# Patient Record
Sex: Female | Born: 1980 | Race: White | Hispanic: No | Marital: Married | Smoking: Never smoker
Health system: Southern US, Community
[De-identification: ages and names within clinical notes are randomized; demographics above are authoritative.]

## PROBLEM LIST (undated history)

## (undated) DIAGNOSIS — U071 COVID-19: Secondary | ICD-10-CM

## (undated) DIAGNOSIS — R7989 Other specified abnormal findings of blood chemistry: Secondary | ICD-10-CM

## (undated) DIAGNOSIS — F419 Anxiety disorder, unspecified: Secondary | ICD-10-CM

## (undated) DIAGNOSIS — R112 Nausea with vomiting, unspecified: Secondary | ICD-10-CM

## (undated) DIAGNOSIS — K8020 Calculus of gallbladder without cholecystitis without obstruction: Secondary | ICD-10-CM

## (undated) HISTORY — PX: WISDOM TOOTH EXTRACTION: SHX21

---

## 2021-06-02 ENCOUNTER — Other Ambulatory Visit: Payer: Self-pay | Admitting: Family

## 2021-06-02 ENCOUNTER — Other Ambulatory Visit (HOSPITAL_COMMUNITY): Payer: Self-pay | Admitting: Family

## 2021-06-02 DIAGNOSIS — R1010 Upper abdominal pain, unspecified: Secondary | ICD-10-CM

## 2021-06-11 ENCOUNTER — Other Ambulatory Visit: Payer: Self-pay

## 2021-06-11 ENCOUNTER — Ambulatory Visit
Admission: RE | Admit: 2021-06-11 | Discharge: 2021-06-11 | Disposition: A | Discharge status: Home / Self Care | Payer: No Typology Code available for payment source | Source: Ambulatory Visit | Attending: Family | Admitting: Family

## 2021-06-11 DIAGNOSIS — R1010 Upper abdominal pain, unspecified: Secondary | ICD-10-CM | POA: Insufficient documentation

## 2022-03-04 ENCOUNTER — Other Ambulatory Visit: Payer: Self-pay | Admitting: Emergency Medicine

## 2022-03-04 ENCOUNTER — Other Ambulatory Visit (HOSPITAL_COMMUNITY): Payer: Self-pay | Admitting: Emergency Medicine

## 2022-03-04 DIAGNOSIS — R1010 Upper abdominal pain, unspecified: Secondary | ICD-10-CM

## 2022-03-04 DIAGNOSIS — R10819 Abdominal tenderness, unspecified site: Secondary | ICD-10-CM

## 2022-03-05 ENCOUNTER — Other Ambulatory Visit: Payer: Self-pay | Admitting: Emergency Medicine

## 2022-03-05 DIAGNOSIS — R10819 Abdominal tenderness, unspecified site: Secondary | ICD-10-CM

## 2022-03-05 DIAGNOSIS — R1010 Upper abdominal pain, unspecified: Secondary | ICD-10-CM

## 2022-03-15 ENCOUNTER — Ambulatory Visit
Admission: RE | Admit: 2022-03-15 | Discharge: 2022-03-15 | Disposition: A | Discharge status: Home / Self Care | Payer: BC Managed Care – PPO | Source: Ambulatory Visit | Attending: Emergency Medicine | Admitting: Emergency Medicine

## 2022-03-15 DIAGNOSIS — R10819 Abdominal tenderness, unspecified site: Secondary | ICD-10-CM | POA: Diagnosis not present

## 2022-03-15 DIAGNOSIS — R1010 Upper abdominal pain, unspecified: Secondary | ICD-10-CM | POA: Insufficient documentation

## 2022-03-19 ENCOUNTER — Ambulatory Visit: Payer: Self-pay | Admitting: General Surgery

## 2022-03-19 NOTE — H&P (View-Only) (Signed)
PATIENT PROFILE: Alexandria Meadows is a 41 y.o. female who presents to the Clinic for consultation at the request of Dr. Mayford Knife for evaluation of cholelithiasis  PCP:  Joyce Gross, MD  HISTORY OF PRESENT ILLNESS: Alexandria Meadows reports she continued having right upper quadrant pain.  Pain radiates to both side of the abdomen.  Pain aggravated by oral intake.  She denies any alleviating factors.  She denies any fever or chills.  She was evaluated by her physician and an ultrasound was made showing cholelithiasis without sign of cholecystitis.  I personally evaluated the images.   PROBLEM LIST: Cholelithiasis  GENERAL REVIEW OF SYSTEMS:   General ROS: negative for - chills, fatigue, fever, weight gain or weight loss Allergy and Immunology ROS: negative for - hives  Hematological and Lymphatic ROS: negative for - bleeding problems or bruising, negative for palpable nodes Endocrine ROS: negative for - heat or cold intolerance, hair changes Respiratory ROS: negative for - cough, shortness of breath or wheezing Cardiovascular ROS: no chest pain or palpitations GI ROS: negative for nausea, vomiting, positive for abdominal pain Musculoskeletal ROS: negative for - joint swelling or muscle pain Neurological ROS: negative for - confusion, syncope Dermatological ROS: negative for pruritus and rash Psychiatric: negative for anxiety, depression, difficulty sleeping and memory loss  MEDICATIONS: Current Outpatient Medications  Medication Sig Dispense Refill   citalopram (CELEXA) 20 MG tablet once daily     multivitamin tablet Take 1 tablet by mouth once daily     norgestimate-ethinyl estradioL (SPRINTEC 0.25/35, 28,) 0.25-35 mg-mcg tablet Take 1 tablet by mouth once daily     No current facility-administered medications for this visit.    ALLERGIES: Penicillins  PAST MEDICAL HISTORY: History reviewed. No pertinent past medical history.  PAST SURGICAL HISTORY: History reviewed. No pertinent  surgical history.   FAMILY HISTORY: Family History  Problem Relation Age of Onset   Autoimmune disease Mother    Prostate cancer Father    No Known Problems Sister    No Known Problems Brother      SOCIAL HISTORY: Social History   Socioeconomic History   Marital status: Married  Tobacco Use   Smoking status: Never   Smokeless tobacco: Never  Vaping Use   Vaping Use: Never used  Substance and Sexual Activity   Alcohol use: Never   Drug use: Never    PHYSICAL EXAM: Vitals:   03/19/22 1020  BP: 117/76  Pulse: 81   Body mass index is 33.61 kg/m. Weight: 91.6 kg (202 lb)   GENERAL: Alert, active, oriented x3  HEENT: Pupils equal reactive to light. Extraocular movements are intact. Sclera clear. Palpebral conjunctiva normal red color.Pharynx clear.  NECK: Supple with no palpable mass and no adenopathy.  LUNGS: Sound clear with no rales rhonchi or wheezes.  HEART: Regular rhythm S1 and S2 without murmur.  ABDOMEN: Soft and depressible, nontender with no palpable mass, no hepatomegaly.   EXTREMITIES: Well-developed well-nourished symmetrical with no dependent edema.  NEUROLOGICAL: Awake alert oriented, facial expression symmetrical, moving all extremities.  REVIEW OF DATA: I have reviewed the following data today: Office Visit on 03/19/2022  Component Date Value   WBC (White Blood Cell Co* 03/19/2022 5.5    RBC (Red Blood Cell Coun* 03/19/2022 4.77    Hemoglobin 03/19/2022 14.5    Hematocrit 03/19/2022 44.5    MCV (Mean Corpuscular Vo* 03/19/2022 93.3    MCH (Mean Corpuscular He* 03/19/2022 30.4    MCHC (Mean Corpuscular H* 03/19/2022 32.6  Platelet Count 03/19/2022 194    RDW-CV (Red Cell Distrib* 03/19/2022 12.3    MPV (Mean Platelet Volum* 03/19/2022 11.2    Neutrophils 03/19/2022 3.28    Lymphocytes 03/19/2022 1.63    Monocytes 03/19/2022 0.48    Eosinophils 03/19/2022 0.08    Basophils 03/19/2022 0.04    Neutrophil % 03/19/2022 59.5    Lymphocyte  % 03/19/2022 29.6    Monocyte % 03/19/2022 8.7    Eosinophil % 03/19/2022 1.5    Basophil% 03/19/2022 0.7    Immature Granulocyte % 03/19/2022 0.0    Immature Granulocyte Cou* 03/19/2022 0.00      ASSESSMENT: Alexandria Meadows is a 41 y.o. female presenting for consultation for cholelithiasis.    Patient was oriented about the diagnosis of cholelithiasis. Also oriented about what is the gallbladder, its anatomy and function and the implications of having stones. The patient was oriented about the treatment alternatives (observation vs cholecystectomy). Patient was oriented that a low percentage of patient will continue to have similar pain symptoms even after the gallbladder is removed. Surgical technique (open vs laparoscopic) was discussed. It was also discussed the goals of the surgery (decrease the pain episodes and avoid the risk of cholecystitis) and the risk of surgery including: bleeding, infection, common bile duct injury, stone retention, injury to other organs such as bowel, liver, stomach, other complications such as hernia, bowel obstruction among others. Also discussed with patient about anesthesia and its complications such as: reaction to medications, pneumonia, heart complications, death, among others.   Cholelithiasis without cholecystitis [K80.20]  PLAN: 1.  Robotic assisted laparoscopic cholecystectomy (73532) 2.  CBC, CMP 3.  Do not take aspirin 5 days before the procedure 5.  Contact us if has any question or concern.   Patient verbalized understanding, all questions were answered, and were agreeable with the plan outlined above.     Carolan Shiver, MD  Electronically signed by Carolan Shiver, MD

## 2022-03-24 ENCOUNTER — Encounter
Admission: RE | Admit: 2022-03-24 | Discharge: 2022-03-24 | Disposition: A | Discharge status: Home / Self Care | Payer: BC Managed Care – PPO | Source: Ambulatory Visit | Attending: General Surgery | Admitting: General Surgery

## 2022-03-24 VITALS — Ht 65.0 in | Wt 202.0 lb

## 2022-03-24 DIAGNOSIS — Z01818 Encounter for other preprocedural examination: Secondary | ICD-10-CM

## 2022-03-24 HISTORY — DX: Other specified abnormal findings of blood chemistry: R79.89

## 2022-03-24 HISTORY — DX: COVID-19: U07.1

## 2022-03-24 HISTORY — DX: Other specified postprocedural states: R11.2

## 2022-03-24 HISTORY — DX: Anxiety disorder, unspecified: F41.9

## 2022-03-24 HISTORY — DX: Calculus of gallbladder without cholecystitis without obstruction: K80.20

## 2022-03-24 NOTE — Patient Instructions (Signed)
Your procedure is scheduled on:03-26-22 Friday Report to the Registration Desk on the 1st floor of the [facility].Then proceed to the 2nd floor Surgery Desk To find out your arrival time, please call [phone] between 1PM - 3PM on:03-25-22 Thursday If your arrival time is 6:00 am, do not arrive prior to that time as the [facility] entrance doors do not open until 6:00 am.  REMEMBER: Instructions that are not followed completely may result in serious medical risk, up to and including death; or upon the discretion of your surgeon and anesthesiologist your surgery may need to be rescheduled.  Do not eat food OR drink any liquids after midnight the night before surgery.  No gum chewing, lozengers or hard candies.  TAKE THESE MEDICATIONS THE MORNING OF SURGERY WITH A SIP OF WATER: -citalopram (CELEXA)  One week prior to surgery: Stop Anti-inflammatories (NSAIDS) such as Advil, Aleve, Ibuprofen, Motrin, Naproxen, Naprosyn and Aspirin based products such as Excedrin, Goodys Powder, BC Powder.You may however, take Tylenol if needed for pain up until the day of surgery.  Stop ANY OVER THE COUNTER supplements/vitamins NOW (03-24-22) until after surgery.  No Alcohol for 24 hours before or after surgery.  No Smoking including e-cigarettes for 24 hours prior to surgery.  No chewable tobacco products for at least 6 hours prior to surgery.  No nicotine patches on the day of surgery.  Do not use any "recreational" drugs for at least a week prior to your surgery.  Please be advised that the combination of cocaine and anesthesia may have negative outcomes, up to and including death. If you test positive for cocaine, your surgery will be cancelled.  On the morning of surgery brush your teeth with toothpaste and water, you may rinse your mouth with mouthwash if you wish. Do not swallow any toothpaste or mouthwash.  Use CHG Soap as directed on instruction sheet.  Do not wear jewelry, make-up,  hairpins, clips or nail polish.  Do not wear lotions, powders, or perfumes.   Do not shave body from the neck down 48 hours prior to surgery just in case you cut yourself which could leave a site for infection.  Also, freshly shaved skin may become irritated if using the CHG soap.  Contact lenses, hearing aids and dentures may not be worn into surgery.  Do not bring valuables to the hospital. [facility] is not responsible for any missing/lost belongings or valuables.   Notify your doctor if there is any change in your medical condition (cold, fever, infection).  Wear comfortable clothing (specific to your surgery type) to the hospital.  After surgery, you can help prevent lung complications by doing breathing exercises.  Take deep breaths and cough every 1-2 hours. Your doctor may order a device called an Incentive Spirometer to help you take deep breaths. When coughing or sneezing, hold a pillow firmly against your incision with both hands. This is called "splinting." Doing this helps protect your incision. It also decreases belly discomfort.  If you are being admitted to the hospital overnight, leave your suitcase in the car. After surgery it may be brought to your room.  If you are being discharged the day of surgery, you will not be allowed to drive home. You will need a responsible adult (18 years or older) to drive you home and stay with you that night.   If you are taking public transportation, you will need to have a responsible adult (18 years or older) with you. Please confirm  with your physician that it is acceptable to use public transportation.   Please call the [facility] at [phone] if you have any questions about these instructions.  Surgery Visitation Policy:  Patients undergoing a surgery or procedure may have two family members or support persons with them as long as the person is not COVID-19 positive or experiencing its symptoms.

## 2022-03-26 ENCOUNTER — Ambulatory Visit
Admission: RE | Admit: 2022-03-26 | Discharge: 2022-03-26 | Disposition: A | Discharge status: Home / Self Care | Payer: BC Managed Care – PPO | Source: Ambulatory Visit | Attending: General Surgery | Admitting: General Surgery

## 2022-03-26 ENCOUNTER — Encounter
Admission: RE | Disposition: A | Discharge status: Home / Self Care | Payer: Self-pay | Source: Ambulatory Visit | Attending: General Surgery

## 2022-03-26 ENCOUNTER — Encounter: Payer: Self-pay | Admitting: General Surgery

## 2022-03-26 ENCOUNTER — Ambulatory Visit: Payer: BC Managed Care – PPO | Admitting: Registered Nurse

## 2022-03-26 ENCOUNTER — Other Ambulatory Visit: Payer: Self-pay

## 2022-03-26 DIAGNOSIS — K8020 Calculus of gallbladder without cholecystitis without obstruction: Secondary | ICD-10-CM | POA: Diagnosis present

## 2022-03-26 DIAGNOSIS — K8010 Calculus of gallbladder with chronic cholecystitis without obstruction: Secondary | ICD-10-CM | POA: Diagnosis not present

## 2022-03-26 DIAGNOSIS — Z8616 Personal history of COVID-19: Secondary | ICD-10-CM | POA: Diagnosis not present

## 2022-03-26 LAB — POCT PREGNANCY, URINE: Preg Test, Ur: NEGATIVE

## 2022-03-26 SURGERY — CHOLECYSTECTOMY, ROBOT-ASSISTED, LAPAROSCOPIC
Anesthesia: General | Site: Abdomen

## 2022-03-26 MED ORDER — FENTANYL CITRATE (PF) 100 MCG/2ML IJ SOLN
INTRAMUSCULAR | Status: AC
  Administered 2022-03-26: 25 ug via INTRAVENOUS
  Filled 2022-03-26: qty 2

## 2022-03-26 MED ORDER — OXYCODONE HCL 5 MG PO TABS
ORAL_TABLET | ORAL | Status: AC
  Filled 2022-03-26: qty 1

## 2022-03-26 MED ORDER — PROPOFOL 1000 MG/100ML IV EMUL
INTRAVENOUS | Status: AC
  Filled 2022-03-26: qty 100

## 2022-03-26 MED ORDER — LIDOCAINE HCL (PF) 2 % IJ SOLN
INTRAMUSCULAR | Status: AC
  Filled 2022-03-26: qty 5

## 2022-03-26 MED ORDER — OXYCODONE HCL 5 MG PO TABS: 5.0000 mg | ORAL_TABLET | Freq: Once | ORAL | Status: AC

## 2022-03-26 MED ORDER — FENTANYL CITRATE (PF) 100 MCG/2ML IJ SOLN
INTRAMUSCULAR | Status: DC | PRN
  Administered 2022-03-26: 50 ug via INTRAVENOUS
  Administered 2022-03-26 (×2): 25 ug via INTRAVENOUS

## 2022-03-26 MED ORDER — DEXMEDETOMIDINE (PRECEDEX) IN NS 20 MCG/5ML (4 MCG/ML) IV SYRINGE
PREFILLED_SYRINGE | INTRAVENOUS | Status: DC | PRN
  Administered 2022-03-26 (×3): 4 ug via INTRAVENOUS

## 2022-03-26 MED ORDER — OXYCODONE HCL 5 MG PO TABS
5.0000 mg | ORAL_TABLET | Freq: Once | ORAL | Status: AC
  Administered 2022-03-26: 5 mg via ORAL

## 2022-03-26 MED ORDER — KETOROLAC TROMETHAMINE 30 MG/ML IJ SOLN
30.0000 mg | Freq: Once | INTRAMUSCULAR | Status: AC
  Administered 2022-03-26: 30 mg via INTRAVENOUS

## 2022-03-26 MED ORDER — ONDANSETRON HCL 4 MG/2ML IJ SOLN
INTRAMUSCULAR | Status: DC | PRN
  Administered 2022-03-26: 4 mg via INTRAVENOUS

## 2022-03-26 MED ORDER — FENTANYL CITRATE (PF) 100 MCG/2ML IJ SOLN
INTRAMUSCULAR | Status: AC
  Filled 2022-03-26: qty 2

## 2022-03-26 MED ORDER — ONDANSETRON HCL 4 MG/2ML IJ SOLN
INTRAMUSCULAR | Status: AC
  Filled 2022-03-26: qty 2

## 2022-03-26 MED ORDER — CEFAZOLIN SODIUM-DEXTROSE 2-4 GM/100ML-% IV SOLN: 2.0000 g | INTRAVENOUS | Status: DC

## 2022-03-26 MED ORDER — SUGAMMADEX SODIUM 200 MG/2ML IV SOLN
INTRAVENOUS | Status: DC | PRN
  Administered 2022-03-26: 177 mg via INTRAVENOUS

## 2022-03-26 MED ORDER — CEFAZOLIN SODIUM-DEXTROSE 2-4 GM/100ML-% IV SOLN
INTRAVENOUS | Status: AC
  Filled 2022-03-26: qty 100

## 2022-03-26 MED ORDER — INDOCYANINE GREEN 25 MG IV SOLR
1.2500 mg | Freq: Once | INTRAVENOUS | Status: AC
  Administered 2022-03-26: 1.25 mg via INTRAVENOUS
  Filled 2022-03-26: qty 0.5

## 2022-03-26 MED ORDER — KETOROLAC TROMETHAMINE 30 MG/ML IJ SOLN
INTRAMUSCULAR | Status: AC
  Filled 2022-03-26: qty 1

## 2022-03-26 MED ORDER — FAMOTIDINE 20 MG PO TABS
ORAL_TABLET | ORAL | Status: AC
  Administered 2022-03-26: 20 mg
  Filled 2022-03-26: qty 1

## 2022-03-26 MED ORDER — PROPOFOL 10 MG/ML IV BOLUS
INTRAVENOUS | Status: DC | PRN
  Administered 2022-03-26: 150 mg via INTRAVENOUS

## 2022-03-26 MED ORDER — DEXAMETHASONE SODIUM PHOSPHATE 10 MG/ML IJ SOLN
INTRAMUSCULAR | Status: DC | PRN
  Administered 2022-03-26: 10 mg via INTRAVENOUS

## 2022-03-26 MED ORDER — ROCURONIUM BROMIDE 100 MG/10ML IV SOLN
INTRAVENOUS | Status: DC | PRN
  Administered 2022-03-26: 5 mg via INTRAVENOUS
  Administered 2022-03-26: 60 mg via INTRAVENOUS
  Administered 2022-03-26: 20 mg via INTRAVENOUS

## 2022-03-26 MED ORDER — MIDAZOLAM HCL 2 MG/2ML IJ SOLN
INTRAMUSCULAR | Status: DC | PRN
  Administered 2022-03-26: 2 mg via INTRAVENOUS

## 2022-03-26 MED ORDER — CHLORHEXIDINE GLUCONATE 0.12 % MT SOLN
OROMUCOSAL | Status: AC
  Administered 2022-03-26: 15 mL
  Filled 2022-03-26: qty 15

## 2022-03-26 MED ORDER — MIDAZOLAM HCL 2 MG/2ML IJ SOLN
INTRAMUSCULAR | Status: AC
  Filled 2022-03-26: qty 2

## 2022-03-26 MED ORDER — DIPHENHYDRAMINE HCL 50 MG/ML IJ SOLN
INTRAMUSCULAR | Status: AC
  Filled 2022-03-26: qty 1

## 2022-03-26 MED ORDER — "VISTASEAL 4 ML SINGLE DOSE KIT "
PACK | CUTANEOUS | Status: DC | PRN
  Administered 2022-03-26: 4 mL via TOPICAL

## 2022-03-26 MED ORDER — 0.9 % SODIUM CHLORIDE (POUR BTL) OPTIME
TOPICAL | Status: DC | PRN
  Administered 2022-03-26: 500 mL

## 2022-03-26 MED ORDER — OXYCODONE HCL 5 MG PO TABS
ORAL_TABLET | ORAL | Status: AC
  Administered 2022-03-26: 5 mg via ORAL
  Filled 2022-03-26: qty 1

## 2022-03-26 MED ORDER — BUPIVACAINE-EPINEPHRINE (PF) 0.25% -1:200000 IJ SOLN
INTRAMUSCULAR | Status: AC
  Filled 2022-03-26: qty 30

## 2022-03-26 MED ORDER — LIDOCAINE HCL (CARDIAC) PF 100 MG/5ML IV SOSY
PREFILLED_SYRINGE | INTRAVENOUS | Status: DC | PRN
  Administered 2022-03-26: 100 mg via INTRAVENOUS

## 2022-03-26 MED ORDER — KETAMINE HCL 50 MG/5ML IJ SOSY
PREFILLED_SYRINGE | INTRAMUSCULAR | Status: AC
Start: 2022-03-26 — End: ?
  Filled 2022-03-26: qty 5

## 2022-03-26 MED ORDER — OXYCODONE-ACETAMINOPHEN 5-325 MG PO TABS: 1.0000 | ORAL_TABLET | ORAL | 0 refills | Status: AC | PRN

## 2022-03-26 MED ORDER — DEXAMETHASONE SODIUM PHOSPHATE 10 MG/ML IJ SOLN
INTRAMUSCULAR | Status: AC
  Filled 2022-03-26: qty 1

## 2022-03-26 MED ORDER — PROPOFOL 500 MG/50ML IV EMUL
INTRAVENOUS | Status: DC | PRN
  Administered 2022-03-26: 175 ug/kg/min via INTRAVENOUS

## 2022-03-26 MED ORDER — ROCURONIUM BROMIDE 10 MG/ML (PF) SYRINGE
PREFILLED_SYRINGE | INTRAVENOUS | Status: AC
  Filled 2022-03-26: qty 10

## 2022-03-26 MED ORDER — LACTATED RINGERS IV SOLN: INTRAVENOUS | Status: DC

## 2022-03-26 MED ORDER — FENTANYL CITRATE (PF) 100 MCG/2ML IJ SOLN
25.0000 ug | INTRAMUSCULAR | Status: AC | PRN
  Administered 2022-03-26 (×6): 25 ug via INTRAVENOUS

## 2022-03-26 MED ORDER — PROPOFOL 1000 MG/100ML IV EMUL
INTRAVENOUS | Status: AC
  Filled 2022-03-26: qty 200

## 2022-03-26 MED ORDER — ONDANSETRON HCL 4 MG/2ML IJ SOLN
4.0000 mg | Freq: Once | INTRAMUSCULAR | Status: AC | PRN
  Administered 2022-03-26: 4 mg via INTRAVENOUS

## 2022-03-26 MED ORDER — MIDAZOLAM HCL 2 MG/2ML IJ SOLN
INTRAMUSCULAR | Status: AC
Start: 2022-03-26 — End: ?
  Filled 2022-03-26: qty 2

## 2022-03-26 MED ORDER — ACETAMINOPHEN 10 MG/ML IV SOLN
INTRAVENOUS | Status: DC | PRN
  Administered 2022-03-26: 1000 mg via INTRAVENOUS

## 2022-03-26 MED ORDER — BUPIVACAINE-EPINEPHRINE 0.25% -1:200000 IJ SOLN
INTRAMUSCULAR | Status: DC | PRN
  Administered 2022-03-26: 30 mL

## 2022-03-26 SURGICAL SUPPLY — 56 items
APPLICATOR VISTASEAL 35 (MISCELLANEOUS) ×1 IMPLANT
BAG PRESSURE INF REUSE 1000 (BAG) IMPLANT
BLADE SURG SZ11 CARB STEEL (BLADE) ×3 IMPLANT
CANNULA REDUC XI 12-8 STAPL (CANNULA) ×1
CANNULA REDUCER 12-8 DVNC XI (CANNULA) ×2 IMPLANT
CATH REDDICK CHOLANGI 4FR 50CM (CATHETERS) IMPLANT
CLIP LIGATING HEM O LOK PURPLE (MISCELLANEOUS) IMPLANT
CLIP LIGATING HEMO O LOK GREEN (MISCELLANEOUS) ×3 IMPLANT
DERMABOND ADVANCED (GAUZE/BANDAGES/DRESSINGS) ×1
DERMABOND ADVANCED .7 DNX12 (GAUZE/BANDAGES/DRESSINGS) ×2 IMPLANT
DRAPE ARM DVNC X/XI (DISPOSABLE) ×8 IMPLANT
DRAPE C-ARM XRAY 36X54 (DRAPES) IMPLANT
DRAPE COLUMN DVNC XI (DISPOSABLE) ×2 IMPLANT
DRAPE DA VINCI XI ARM (DISPOSABLE) ×4
DRAPE DA VINCI XI COLUMN (DISPOSABLE) ×1
ELECT REM PT RETURN 9FT ADLT (ELECTROSURGICAL) ×3
ELECTRODE REM PT RTRN 9FT ADLT (ELECTROSURGICAL) ×2 IMPLANT
GLOVE BIO SURGEON STRL SZ 6.5 (GLOVE) ×6 IMPLANT
GLOVE BIOGEL PI IND STRL 6.5 (GLOVE) ×4 IMPLANT
GLOVE BIOGEL PI INDICATOR 6.5 (GLOVE) ×2
GOWN STRL REUS W/ TWL LRG LVL3 (GOWN DISPOSABLE) ×6 IMPLANT
GOWN STRL REUS W/TWL LRG LVL3 (GOWN DISPOSABLE) ×3
GRASPER SUT TROCAR 14GX15 (MISCELLANEOUS) ×3 IMPLANT
IRRIGATOR SUCT 8 DISP DVNC XI (IRRIGATION / IRRIGATOR) IMPLANT
IRRIGATOR SUCTION 8MM XI DISP (IRRIGATION / IRRIGATOR)
IV CATH ANGIO 12GX3 LT BLUE (NEEDLE) IMPLANT
IV NS 1000ML (IV SOLUTION)
IV NS 1000ML BAXH (IV SOLUTION) IMPLANT
KIT PINK PAD W/HEAD ARE REST (MISCELLANEOUS) ×3
KIT PINK PAD W/HEAD ARM REST (MISCELLANEOUS) ×2 IMPLANT
LABEL OR SOLS (LABEL) ×3 IMPLANT
MANIFOLD NEPTUNE II (INSTRUMENTS) ×3 IMPLANT
NDL INSUFFLATION 14GA 120MM (NEEDLE) ×2 IMPLANT
NEEDLE HYPO 22GX1.5 SAFETY (NEEDLE) ×3 IMPLANT
NEEDLE INSUFFLATION 14GA 120MM (NEEDLE) ×3 IMPLANT
NS IRRIG 500ML POUR BTL (IV SOLUTION) ×3 IMPLANT
OBTURATOR OPTICAL STANDARD 8MM (TROCAR) ×1
OBTURATOR OPTICAL STND 8 DVNC (TROCAR) ×2
OBTURATOR OPTICALSTD 8 DVNC (TROCAR) ×2 IMPLANT
PACK LAP CHOLECYSTECTOMY (MISCELLANEOUS) ×3 IMPLANT
SEAL CANN UNIV 5-8 DVNC XI (MISCELLANEOUS) ×6 IMPLANT
SEAL XI 5MM-8MM UNIVERSAL (MISCELLANEOUS) ×3
SET TUBE SMOKE EVAC HIGH FLOW (TUBING) ×3 IMPLANT
SOLUTION ELECTROLUBE (MISCELLANEOUS) ×3 IMPLANT
SPIKE FLUID TRANSFER (MISCELLANEOUS) ×6 IMPLANT
SPONGE T-LAP 4X18 ~~LOC~~+RFID (SPONGE) IMPLANT
STAPLER CANNULA SEAL DVNC XI (STAPLE) ×2 IMPLANT
STAPLER CANNULA SEAL XI (STAPLE) ×1
SUT MNCRL 4-0 (SUTURE) ×1
SUT MNCRL 4-0 27XMFL (SUTURE) ×2
SUT MNCRL AB 4-0 PS2 18 (SUTURE) ×1 IMPLANT
SUT VICRYL 0 AB UR-6 (SUTURE) ×3 IMPLANT
SUTURE MNCRL 4-0 27XMF (SUTURE) ×2 IMPLANT
SYS BAG RETRIEVAL 10MM (BASKET) ×3
SYSTEM BAG RETRIEVAL 10MM (BASKET) ×2 IMPLANT
WATER STERILE IRR 500ML POUR (IV SOLUTION) ×3 IMPLANT

## 2022-03-26 NOTE — Anesthesia Procedure Notes (Addendum)
Procedure Name: Intubation Date/Time: 03/26/2022 10:31 AM  Performed by: Doreen Salvage, CRNAPre-anesthesia Checklist: Patient identified, Patient being monitored, Timeout performed, Emergency Drugs available and Suction available Patient Re-evaluated:Patient Re-evaluated prior to induction Oxygen Delivery Method: Circle system utilized Preoxygenation: Pre-oxygenation with 100% oxygen Induction Type: IV induction Ventilation: Mask ventilation without difficulty Laryngoscope Size: Mac and 3 Grade View: Grade I Tube type: Oral Tube size: 6.5 mm Number of attempts: 1 Airway Equipment and Method: Stylet Placement Confirmation: ETT inserted through vocal cords under direct vision, positive ETCO2 and breath sounds checked- equal and bilateral Secured at: 23 cm Tube secured with: Tape Dental Injury: Teeth and Oropharynx as per pre-operative assessment  Comments: Small chip noted to right front tooth prior to intubation. Chip remains the same post intubation

## 2022-03-26 NOTE — Anesthesia Preprocedure Evaluation (Signed)
Anesthesia Evaluation  Patient identified by MRN, date of birth, ID band Patient awake    Reviewed: Allergy & Precautions, H&P , NPO status , Patient's Chart, lab work & pertinent test results, reviewed documented beta blocker date and time   History of Anesthesia Complications (+) PONV and history of anesthetic complications  Airway Mallampati: II  TM Distance: >3 FB Neck ROM: full    Dental  (+) Teeth Intact   Pulmonary neg pulmonary ROS,    Pulmonary exam normal        Cardiovascular Exercise Tolerance: Good negative cardio ROS Normal cardiovascular exam Rhythm:regular Rate:Normal     Neuro/Psych Anxiety negative neurological ROS  negative psych ROS   GI/Hepatic negative GI ROS, Neg liver ROS,   Endo/Other  negative endocrine ROS  Renal/GU negative Renal ROS  negative genitourinary   Musculoskeletal   Abdominal   Peds  Hematology negative hematology ROS (+)   Anesthesia Other Findings Past Medical History: No date: Anxiety No date: Cholelithiasis No date: COVID-19 No date: Elevated d-dimer No date: PONV (postoperative nausea and vomiting)     Comment:  very nauseated with wisdom teeth and after 2 epidurals Past Surgical History: No date: WISDOM TOOTH EXTRACTION     Comment:  age 41 BMI    Body Mass Index: 32.45 kg/m     Reproductive/Obstetrics negative OB ROS                             Anesthesia Physical Anesthesia Plan  ASA: 2  Anesthesia Plan: General ETT   Post-op Pain Management:    Induction:   PONV Risk Score and Plan:   Airway Management Planned:   Additional Equipment:   Intra-op Plan:   Post-operative Plan:   Informed Consent: I have reviewed the patients History and Physical, chart, labs and discussed the procedure including the risks, benefits and alternatives for the proposed anesthesia with the patient or authorized representative who has  indicated his/her understanding and acceptance.     Dental Advisory Given  Plan Discussed with: CRNA  Anesthesia Plan Comments:         Anesthesia Quick Evaluation

## 2022-03-26 NOTE — Transfer of Care (Signed)
Immediate Anesthesia Transfer of Care Note  Patient: Alexandria Meadows  Procedure(s) Performed: XI ROBOTIC ASSISTED LAPAROSCOPIC CHOLECYSTECTOMY (Abdomen) INDOCYANINE GREEN FLUORESCENCE IMAGING (ICG)  Patient Location: PACU  Anesthesia Type:General  Level of Consciousness: drowsy  Airway & Oxygen Therapy: Patient Spontanous Breathing and Patient connected to face mask oxygen  Post-op Assessment: Report given to RN and Post -op Vital signs reviewed and stable  Post vital signs: Reviewed and stable  Last Vitals:  Vitals Value Taken Time  BP 111/73 03/26/22 1236  Temp 36.4 C 03/26/22 1236  Pulse 71 03/26/22 1237  Resp 21 03/26/22 1237  SpO2 100 % 03/26/22 1237  Vitals shown include unvalidated device data.  Last Pain:  Vitals:   03/26/22 0938  TempSrc: Temporal  PainSc: 0-No pain         Complications: No notable events documented.

## 2022-03-26 NOTE — Op Note (Signed)
Preoperative diagnosis: Cholelithiasis  Postoperative diagnosis: Same  Procedure: Robotic Assisted Laparoscopic Cholecystectomy.   Anesthesia: GETA   Surgeon: Dr. Hazle Quant  Wound Classification: Clean Contaminated  Indications: Patient is a 41 y.o. female developed right upper quadrant pain and on workup was found to have cholelithiasis with a normal common duct. Robotic Assisted Laparoscopic cholecystectomy was elected.  Findings: Very short cystic duct going into right hepatic duct Critical view of safety achieved Cystic duct and artery identified, ligated and divided Adequate hemostasis  Page 1: 2 biliary structures that goes from liver to duodenum. No cystic duct identified.  Page 2: Very short cystic duct going to right hepatic duct Page 3-4: Right and left hepatic ducts            Description of procedure: The patient was placed on the operating table in the supine position. General anesthesia was induced. A time-out was completed verifying correct patient, procedure, site, positioning, and implant(s) and/or special equipment prior to beginning this procedure. An orogastric tube was placed. The abdomen was prepped and draped in the usual sterile fashion.  An incision was made in a natural skin line below the umbilicus.  The fascia was elevated and the Veress needle inserted. Proper position was confirmed by aspiration and saline meniscus test.  The abdomen was insufflated with carbon dioxide to a pressure of 15 mmHg. The patient tolerated insufflation well. A 8-mm trocar was then inserted in optiview fashion.  The laparoscope was inserted and the abdomen inspected. No injuries from initial trocar placement were noted. Additional trocars were then inserted in the following locations: an 8-mm trocar in the left lateral abdomen, and another two 8-mm trocars to the right side of the abdomen 5 cm appart. The umbilical trocar was changed to a 12 mm trocar all under direct  visualization. The abdomen was inspected and no abnormalities were found. The table was placed in the reverse Trendelenburg position with the right side up. The robotic arms were docked and target anatomy identified. Instrument inserted under direct visualization.  Filmy adhesions between the gallbladder and omentum, duodenum and transverse colon were lysed with electrocautery. The dome of the gallbladder was grasped with a prograsp and retracted over the dome of the liver. The infundibulum was also grasped with an atraumatic grasper and retracted toward the right lower quadrant. This maneuver exposed Calot's triangle. The peritoneum overlying the gallbladder infundibulum was then incised and the cystic duct and cystic artery identified and circumferentially dissected. Very short cystic duct was identified. Critical view of safety reviewed before ligating any structure. Firefly images taken to visualize biliary ducts. The cystic duct and cystic artery were then doubly clipped and divided close to the gallbladder.  The gallbladder was then dissected from its peritoneal attachments by electrocautery. Hemostasis was checked and the gallbladder and contained stones were removed using an endoscopic retrieval bag. The gallbladder was passed off the table as a specimen. There was no evidence of bleeding from the gallbladder fossa or cystic artery or leakage of the bile from the cystic duct stump. VistaSeal irrigated on the portal triad and gallbladder fossa. Secondary trocars were removed under direct vision. No bleeding was noted. The robotic arms were undoked. The scope was withdrawn and the umbilical trocar removed. The abdomen was allowed to collapse. The fascia of the 62mm trocar sites was closed with figure-of-eight 0 vicryl sutures. The skin was closed with subcuticular sutures of 4-0 monocryl and topical skin adhesive. The orogastric tube was removed.  The patient tolerated  the procedure well and was taken to  the postanesthesia care unit in stable condition.   Specimen: Gallbladder  Complications: None  EBL: 5 mL

## 2022-03-26 NOTE — Interval H&P Note (Signed)
History and Physical Interval Note:  03/26/2022 10:06 AM  Alexandria Meadows  has presented today for surgery, with the diagnosis of K80.20 Cholelithiasis w/o cholecystitis.  The various methods of treatment have been discussed with the patient and family. After consideration of risks, benefits and other options for treatment, the patient has consented to  Procedure(s): XI ROBOTIC ASSISTED LAPAROSCOPIC CHOLECYSTECTOMY (N/A) INDOCYANINE GREEN FLUORESCENCE IMAGING (ICG) (N/A) as a surgical intervention.  The patient's history has been reviewed, patient examined, no change in status, stable for surgery.  I have reviewed the patient's chart and labs.  Questions were answered to the patient's satisfaction.     Carolan Shiver

## 2022-03-26 NOTE — Discharge Instructions (Addendum)

## 2022-03-29 LAB — SURGICAL PATHOLOGY

## 2022-03-31 NOTE — Anesthesia Postprocedure Evaluation (Signed)
Anesthesia Post Note  Patient: Alexandria Meadows  Procedure(s) Performed: XI ROBOTIC ASSISTED LAPAROSCOPIC CHOLECYSTECTOMY (Abdomen) INDOCYANINE GREEN FLUORESCENCE IMAGING (ICG)  Patient location during evaluation: PACU Anesthesia Type: General Level of consciousness: awake and alert Pain management: pain level controlled Vital Signs Assessment: post-procedure vital signs reviewed and stable Respiratory status: spontaneous breathing, nonlabored ventilation, respiratory function stable and patient connected to nasal cannula oxygen Cardiovascular status: blood pressure returned to baseline and stable Postop Assessment: no apparent nausea or vomiting Anesthetic complications: no   No notable events documented.   Last Vitals:  Vitals:   03/26/22 1626 03/26/22 1658  BP: 129/82 (!) 134/94  Pulse: 79 80  Resp: 16 18  Temp:  36.8 C  SpO2: 99% 98%    Last Pain:  Vitals:   03/26/22 1658  TempSrc: Temporal  PainSc: 4                  Yevette Edwards

## 2023-07-08 IMAGING — US US ABDOMEN COMPLETE
1 series · 13 of 25 positions shown · non-contrast
Comparison: None.

CLINICAL DATA: Two months of upper abdominal pain.

EXAM:
ABDOMEN ULTRASOUND COMPLETE

[Series 1: us abdomen complete · 0.22mm/px · 13 of 98 slices shown]
[im 1/98]
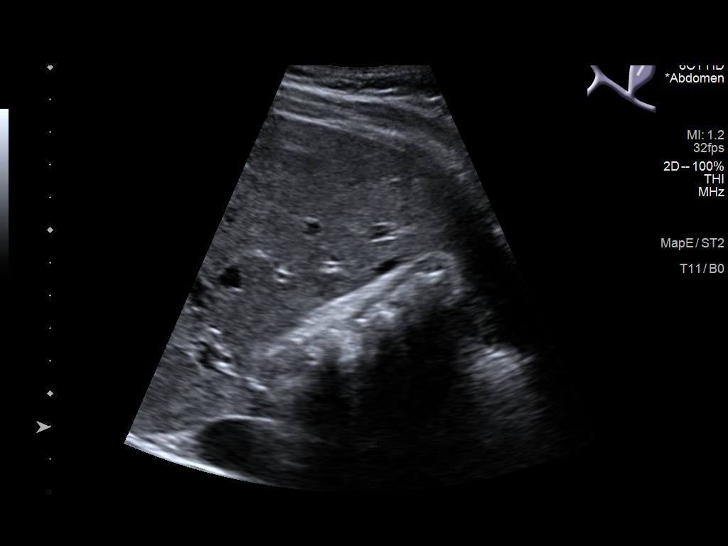
[im 9/98]
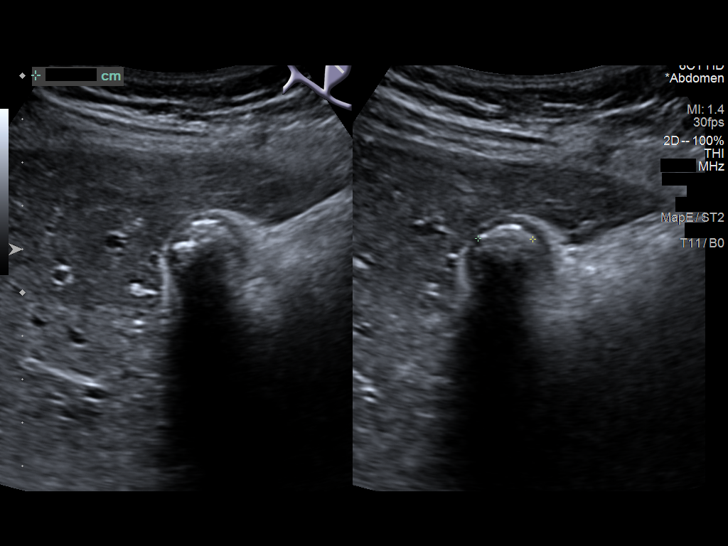
[im 17/98]
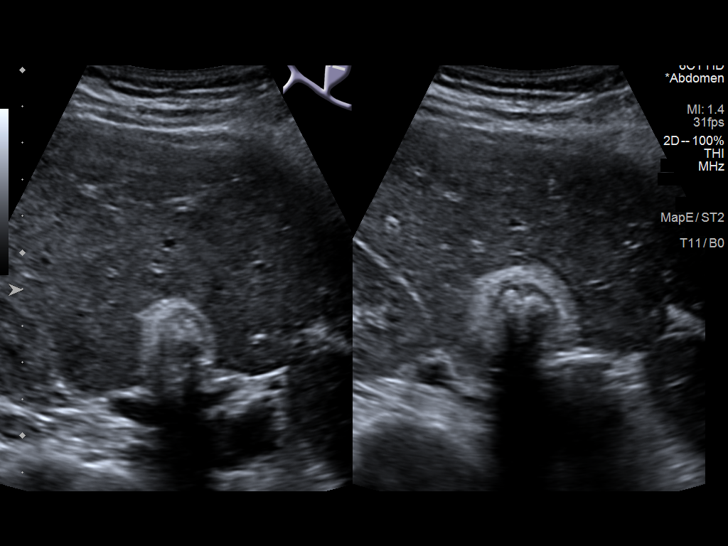
[im 25/98]
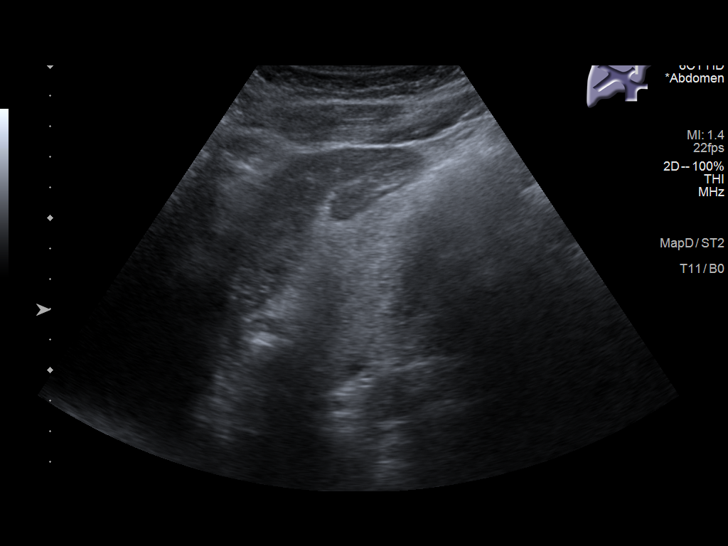
[im 33/98]
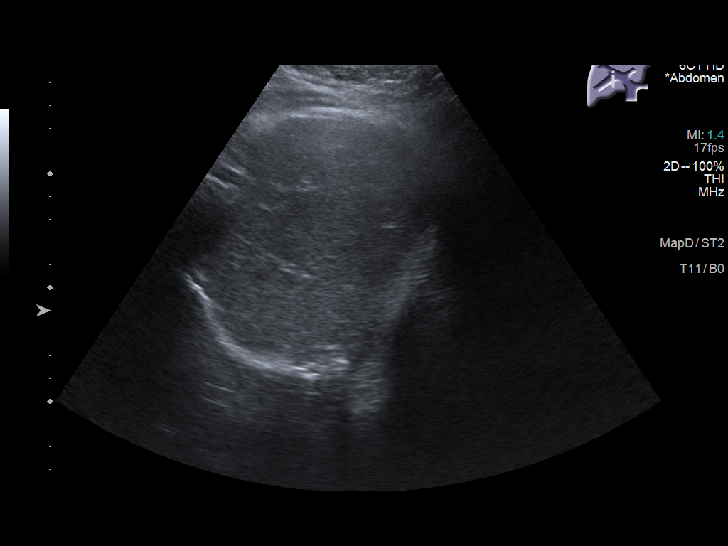
[im 41/98]
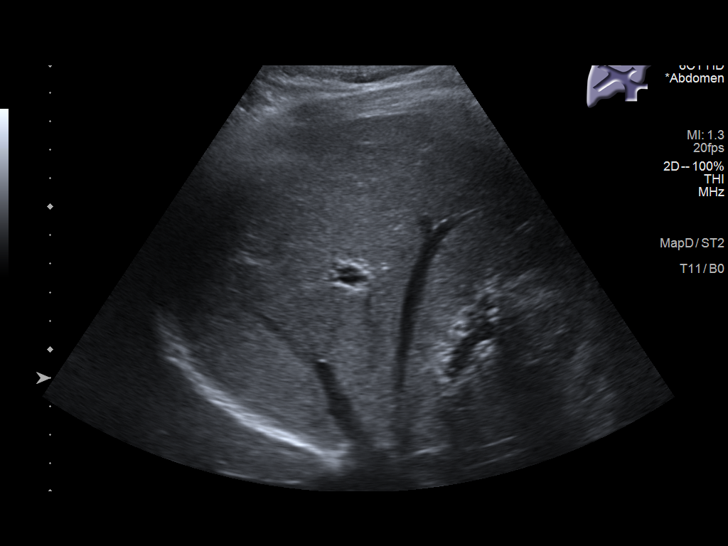
[im 49/98]
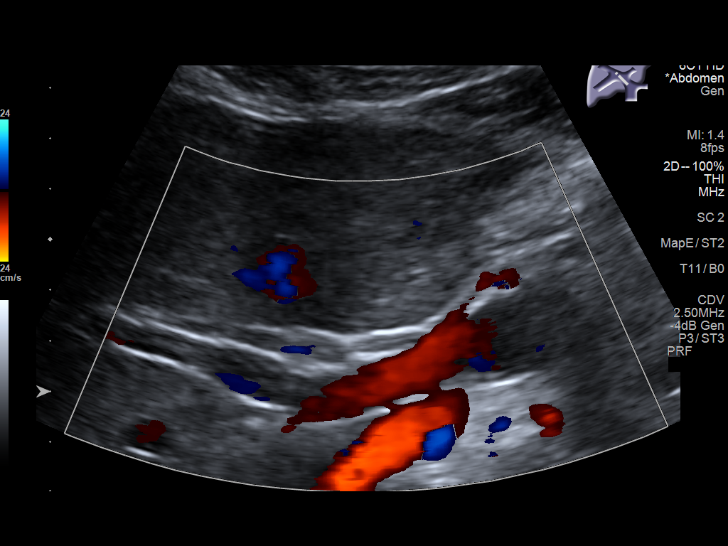
[im 57/98]
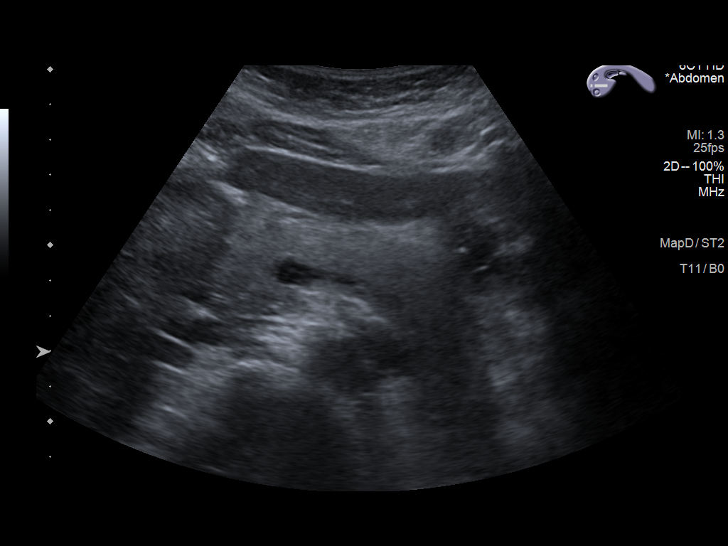
[im 65/98]
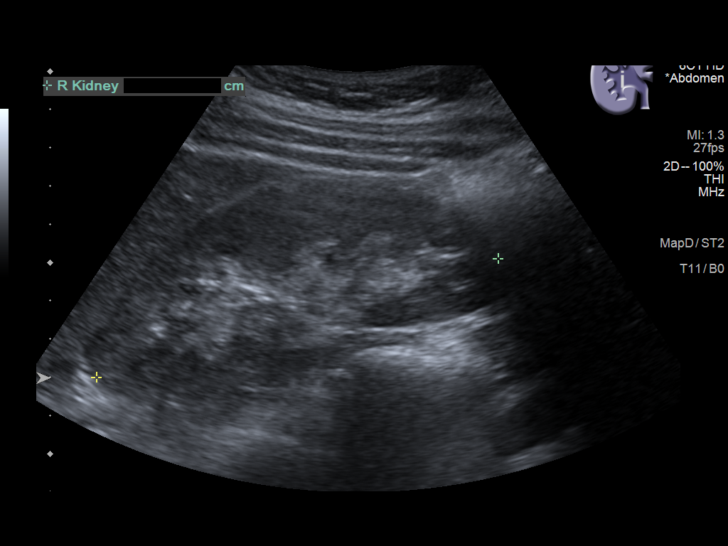
[im 73/98]
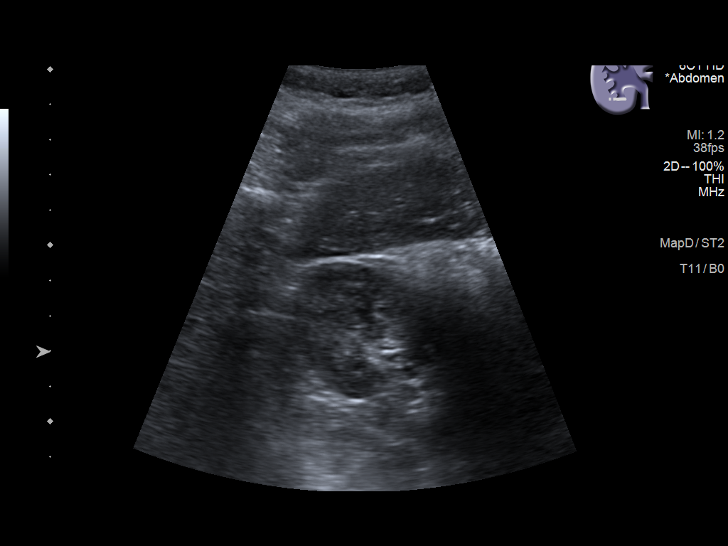
[im 81/98]
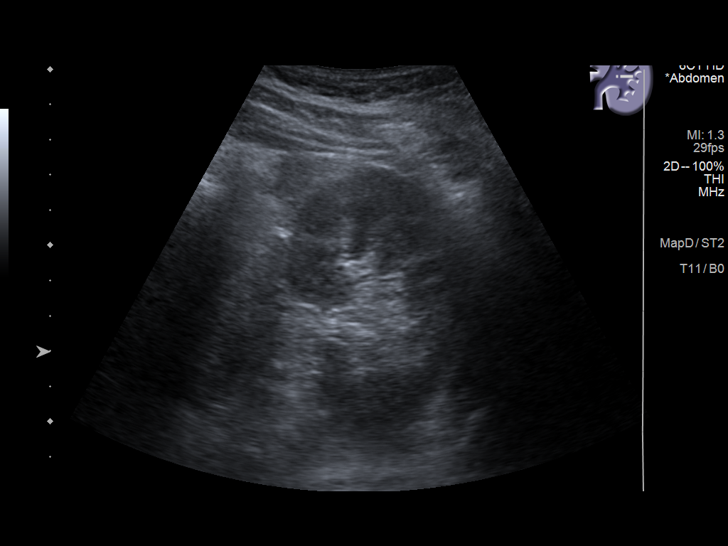
[im 89/98]
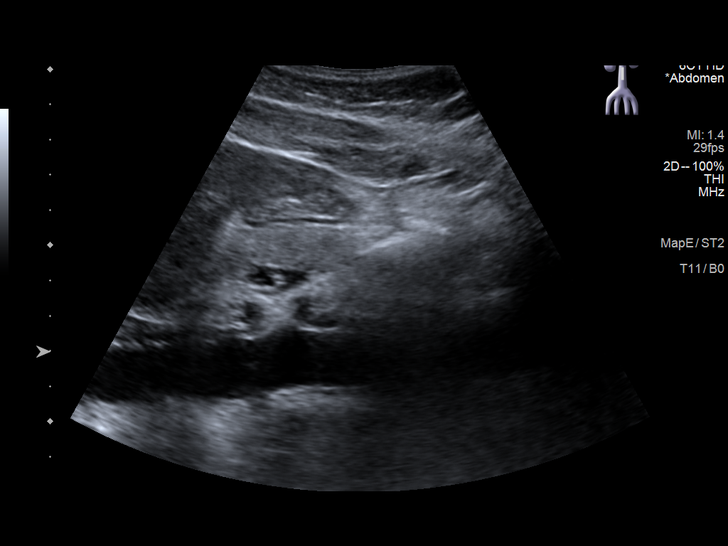
[im 98/98]
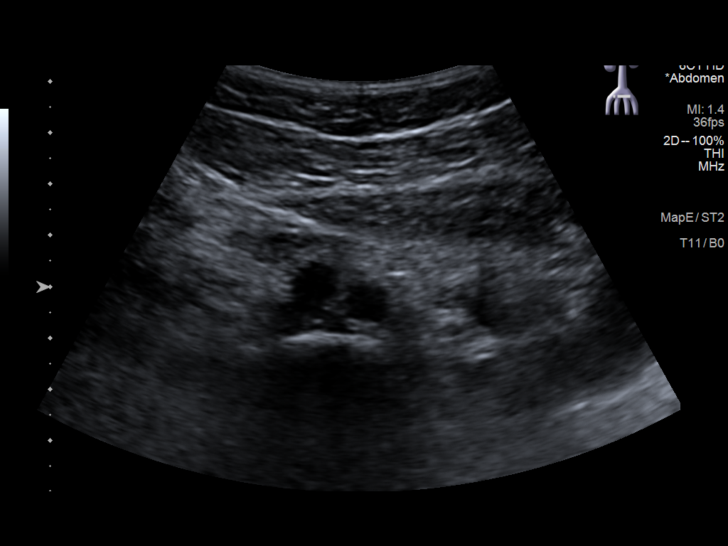

[13 of 25 positions shown; findings below may reference images not displayed]

FINDINGS: Gallbladder: Numerous gallstones measuring up to 15 mm. Focal area
of echogenic wall thickening measuring 6.5 mm likely reflecting
cholesterolosis. The remainder of the gallbladder wall measures
slightly greater than normal in thickness at 3.6 mm. No sonographic
Murphy sign noted by sonographer.

Common bile duct: Diameter: 4 mm

Liver: No focal lesion identified. Within normal limits in
parenchymal echogenicity. Portal vein is patent on color Doppler
imaging with normal direction of blood flow towards the liver.

IVC: No abnormality visualized.

Pancreas: Visualized portion unremarkable.

Spleen: Size and appearance within normal limits.

Right Kidney: Length: 11 cm. Echogenicity within normal limits. No
mass or hydronephrosis visualized.

Left Kidney: Length: 11.8 cm. Echogenicity within normal limits. No
mass or hydronephrosis visualized.

Abdominal aorta: No aneurysm visualized.

Other findings: None.
IMPRESSION: 1. Cholelithiasis with gallbladder wall thickness slightly greater
than normal but without pericholecystic fluid or positive
sonographic Murphy sign. Findings which are equivocal for acute
cholecystitis and are favored to reflect sequela of chronic
inflammation. Consider further evaluation by nuclear medicine HIDA
scan with calculation of ejection fraction to assess for biliary
dyskinesia/chronic cholecystitis if clinically indicated.
2. Focal area of echogenic gallbladder wall thickening likely
reflecting cholesterolosis.

## 2023-10-10 ENCOUNTER — Encounter: Payer: Self-pay | Admitting: Family

## 2023-10-11 ENCOUNTER — Other Ambulatory Visit: Payer: Self-pay | Admitting: Family

## 2023-10-11 DIAGNOSIS — R1010 Upper abdominal pain, unspecified: Secondary | ICD-10-CM

## 2023-10-11 DIAGNOSIS — R7401 Elevation of levels of liver transaminase levels: Secondary | ICD-10-CM

## 2023-10-27 ENCOUNTER — Ambulatory Visit
Admission: RE | Admit: 2023-10-27 | Discharge: 2023-10-27 | Disposition: A | Discharge status: Home / Self Care | Payer: BC Managed Care – PPO | Source: Ambulatory Visit | Attending: Family | Admitting: Family

## 2023-10-27 DIAGNOSIS — R7401 Elevation of levels of liver transaminase levels: Secondary | ICD-10-CM

## 2023-10-27 DIAGNOSIS — R1010 Upper abdominal pain, unspecified: Secondary | ICD-10-CM
